# Patient Record
Sex: Male | Born: 1990 | Race: Black or African American | Hispanic: No | Marital: Single | Smoking: Never smoker
Health system: Southern US, Community
[De-identification: ages and names within clinical notes are randomized; demographics above are authoritative.]

---
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
## 2013-05-06 ENCOUNTER — Ambulatory Visit: Payer: Self-pay | Admitting: Family Medicine

## 2013-05-06 VITALS — BP 110/70 | HR 67 | Temp 97.8°F | Resp 16 | Ht 70.0 in | Wt 137.0 lb

## 2013-05-06 DIAGNOSIS — Z130 Encounter for screening for diseases of the blood and blood-forming organs and certain disorders involving the immune mechanism: Secondary | ICD-10-CM

## 2013-05-06 DIAGNOSIS — Z0000 Encounter for general adult medical examination without abnormal findings: Secondary | ICD-10-CM

## 2013-05-06 NOTE — Progress Notes (Signed)
[facility] [address] [place] [address] [phone]  Date:  05/06/2013   Name:  Willie Trujillo   DOB:  [birth year]   MRN:  [id number]  PCP:  No primary provider on file.    Chief Complaint: Annual Exam   History of Present Illness:  Willie Trujillo is a 22 y.o. very pleasant male patient who presents with the following:  He is planning to try out for the [employer]- try outs are next Thursday.  He is generally healthy, exercises a lot and has never had any syncope or CP while exercising.  He also needs a sickle cell screen.    Went over extensive history form for [employer] sports physical he is pan- negative  He has never been a smoker.  No chronic medications, NKDA  There are no active problems to display for this patient.   History reviewed. No pertinent past medical history.  History reviewed. No pertinent past surgical history.  History  Substance Use Topics  . Smoking status: Never Smoker   . Smokeless tobacco: Not on file  . Alcohol Use: No    History reviewed. No pertinent family history.  No Known Allergies  Medication list has been reviewed and updated.  No current outpatient prescriptions on file prior to visit.   No current facility-administered medications on file prior to visit.    Review of Systems:  As per HPI- otherwise negative.   Physical Examination: Filed Vitals:   05/06/13 1331  BP: 110/70  Pulse: 67  Temp: 97.8 F (36.6 C)  Resp: 16   Filed Vitals:   05/06/13 1331  Height: 5\' 10"  (1.778 m)  Weight: 137 lb (62.143 kg)   Body mass index is 19.66 kg/(m^2). Ideal Body Weight: Weight in (lb) to have BMI = 25: 173.9  GEN: WDWN, NAD, Non-toxic, A & O x 3 HEENT: Atraumatic, Normocephalic. Neck supple. No masses, No LAD. Ears and Nose: No external deformity. CV: RRR, No M/G/R. No JVD. No thrill. No extra heart sounds. PULM: CTA B, no wheezes, crackles, rhonchi. No retractions. No resp.  distress. No accessory muscle use. ABD: S, NT, ND, +BS. No rebound. No HSM. EXTR: No c/c/e.  Normal strength, ROM of joints, normal DTR all extremities  NEURO Normal gait.  PSYCH: Normally interactive. Conversant. Not depressed or anxious appearing.  Calm demeanor.  GU: no inguinal hernia  Assessment and Plan: Screening for sickle-cell disease or trait - Plan: Sickle cell screen  Physical exam  PE for sports.  Cleared for participation, await SS trait   Signed Abbe Amsterdam, MD

## 2015-03-14 DIAGNOSIS — S63619A Unspecified sprain of unspecified finger, initial encounter: Secondary | ICD-10-CM

## 2015-03-14 DIAGNOSIS — S63609A Unspecified sprain of unspecified thumb, initial encounter: Secondary | ICD-10-CM | POA: Insufficient documentation

## 2015-05-19 ENCOUNTER — Ambulatory Visit (INDEPENDENT_AMBULATORY_CARE_PROVIDER_SITE_OTHER): Payer: Managed Care, Other (non HMO) | Admitting: Family Medicine

## 2015-05-19 ENCOUNTER — Ambulatory Visit (INDEPENDENT_AMBULATORY_CARE_PROVIDER_SITE_OTHER): Payer: Managed Care, Other (non HMO)

## 2015-05-19 VITALS — BP 100/60 | HR 81 | Temp 98.7°F | Resp 16 | Ht 72.0 in | Wt 139.0 lb

## 2015-05-19 DIAGNOSIS — M79661 Pain in right lower leg: Secondary | ICD-10-CM

## 2015-05-19 DIAGNOSIS — M25571 Pain in right ankle and joints of right foot: Secondary | ICD-10-CM

## 2015-05-19 DIAGNOSIS — M25471 Effusion, right ankle: Secondary | ICD-10-CM | POA: Diagnosis not present

## 2015-05-19 NOTE — Progress Notes (Signed)
Subjective:    Patient ID: Willie Trujillo, male    DOB: September 23, 1990, 24 y.o.   MRN: [id number] This chart was scribed for Willie Staggers, MD by Littie Deeds, Medical Scribe. This patient was seen in Room 4 and the patient's care was started at 4:31 PM.   HPI HPI Comments: Willie Trujillo is a 24 y.o. male who presents to the [facility] complaining of sudden onset right ankle pain with swelling that started yesterday after twisting his ankle while playing flag football. The pain does extend to part of his right lower leg. Patient is unsure of the mechanism of injury. The pain is worse when bearing weight, but he was able to limp off the field after the injury. He has applied ice to his ankle, but he has not tried any medications. Patient denies previous ankle fractures. He is in school at [employer], studying sports science. He wants to be a Mudlogger or an Runner, broadcasting/film/video.   There are no active problems to display for this patient.  No past medical history on file. No past surgical history on file. No Known Allergies Prior to Admission medications   Not on File   Social History   Social History  . Marital Status: Single    Spouse Name: N/A  . Number of Children: N/A  . Years of Education: N/A   Occupational History  . Not on file.   Social History Main Topics  . Smoking status: Never Smoker   . Smokeless tobacco: Not on file  . Alcohol Use: No  . Drug Use: No  . Sexual Activity: Not on file   Other Topics Concern  . Not on file   Social History Narrative     Review of Systems  Musculoskeletal: Positive for joint swelling and arthralgias.       Objective:   Physical Exam  Constitutional: He is oriented to person, place, and time. He appears well-developed and well-nourished. No distress.  HENT:  Head: Normocephalic and atraumatic.  Mouth/Throat: Oropharynx is clear and moist. No oropharyngeal exudate.  Eyes: Pupils are equal, round, and  reactive to light.  Neck: Neck supple.  Cardiovascular: Normal rate.   Pulmonary/Chest: Effort normal.  Musculoskeletal: He exhibits tenderness.  Right knee non-tender. Mid-tibia tender, no apparent soft tissue swelling or ecchymosis. 5th metatarsal and navicula are non-tender. Medial malleous tender, mid and anterior aspect. Achilles non-tender. Calcaneous non-tender. Diffusely tender with soft tissue swelling over lateral malleolus. Guarded ROM slightly decreased. Positve Kleiger. Negative squeeze. Pain with drawer and talar tilt.  Neurological: He is alert and oriented to person, place, and time. No cranial nerve deficit.  NVI distally.  Skin: Skin is warm and dry. No rash noted.  Psychiatric: He has a normal mood and affect. His behavior is normal.  Nursing note and vitals reviewed.  [facility] (PRIMARY) x-ray report read by Dr. Neva Seat:  Right Tib/fib - No apparent fracture. Right ankle - No apparent fracture.   Filed Vitals:   05/19/15 1613  BP: 100/60  Pulse: 81  Temp: 98.7 F (37.1 C)  Resp: 16  Height: 6' (1.829 m)  Weight: 139 lb (63.05 kg)  SpO2: 98%  PF: 5 L/min      Assessment & Plan:  Willie Trujillo is a 23 y.o. male Pain in joint, ankle and foot, right - Plan: DG Ankle Complete Right  Pain of right lower leg - Plan: DG Tibia/Fibula Right  Swelling of ankle, right - Plan:  DG Ankle Complete Right  Suspected lower tibial contusion, no significant overlying ecchymosis or soft tissue swelling. Symptomatic care discussed.  Right lateral ankle sprain, plus/minus mild high sprain. No apparent fracture seen on x-ray.   -He had a lace up ankle brace already, so was able to use this brace, however he was not appropriately using this, so was fitted and trained on use by my assistant.   - ice, anti-inflammatory, and symptomatic care discussed. Handout given on home exercise program and return to clinic precautions as well as return to sport instructions discussed.  No orders of  the defined types were placed in this encounter.   Patient Instructions  I do not see any broken bones in your x-rays tonight. You may have had a contusion to the front of your lower leg, but if that pain continues this week, can always return for repeat evaluation.  I suspect you have an ankle sprain as we discussed, so can wear the ankle brace as discussed for the next week or two.  See information on ankle sprain below, okay to apply ice on and off for 10 minutes at a time, Tylenol or ibuprofen over-the-counter as needed. If not improving in the next 2 weeks, or worsening sooner, return for recheck.   Acute Ankle Sprain with Phase I Rehab An acute ankle sprain is a partial or complete tear in one or more of the ligaments of the ankle due to traumatic injury. The severity of the injury depends on both the number of ligaments sprained and the grade of sprain. There are 3 grades of sprains.   A grade 1 sprain is a mild sprain. There is a slight pull without obvious tearing. There is no loss of strength, and the muscle and ligament are the correct length.  A grade 2 sprain is a moderate sprain. There is tearing of fibers within the substance of the ligament where it connects two bones or two cartilages. The length of the ligament is increased, and there is usually decreased strength.  A grade 3 sprain is a complete rupture of the ligament and is uncommon. In addition to the grade of sprain, there are three types of ankle sprains.  Lateral ankle sprains: This is a sprain of one or more of the three ligaments on the outer side (lateral) of the ankle. These are the most common sprains. Medial ankle sprains: There is one large triangular ligament of the inner side (medial) of the ankle that is susceptible to injury. Medial ankle sprains are less common. Syndesmosis, "high ankle," sprains: The syndesmosis is the ligament that connects the two bones of the lower leg. Syndesmosis sprains usually only  occur with very severe ankle sprains. SYMPTOMS  Pain, tenderness, and swelling in the ankle, starting at the side of injury that may progress to the whole ankle and foot with time.  "Pop" or tearing sensation at the time of injury.  Bruising that may spread to the heel.  Impaired ability to walk soon after injury. CAUSES   Acute ankle sprains are caused by trauma placed on the ankle that temporarily forces or pries the anklebone (talus) out of its normal socket.  Stretching or tearing of the ligaments that normally hold the joint in place (usually due to a twisting injury). RISK INCREASES WITH:  Previous ankle sprain.  Sports in which the foot may land awkwardly (i.e., basketball, volleyball, or soccer) or walking or running on uneven or rough surfaces.  Shoes with inadequate support to prevent  sideways motion when stress occurs.  Poor strength and flexibility.  Poor balance skills.  Contact sports. PREVENTION   Warm up and stretch properly before activity.  Maintain physical fitness:  Ankle and leg flexibility, muscle strength, and endurance.  Cardiovascular fitness.  Balance training activities.  Use proper technique and have a coach correct improper technique.  Taping, protective strapping, bracing, or high-top tennis shoes may help prevent injury. Initially, tape is best; however, it loses most of its support function within 10 to 15 minutes.  Wear proper-fitted protective shoes (High-top shoes with taping or bracing is more effective than either alone).  Provide the ankle with support during sports and practice activities for 12 months following injury. PROGNOSIS   If treated properly, ankle sprains can be expected to recover completely; however, the length of recovery depends on the degree of injury.  A grade 1 sprain usually heals enough in 5 to 7 days to allow modified activity and requires an average of 6 weeks to heal completely.  A grade 2 sprain  requires 6 to 10 weeks to heal completely.  A grade 3 sprain requires 12 to 16 weeks to heal.  A syndesmosis sprain often takes more than 3 months to heal. RELATED COMPLICATIONS   Frequent recurrence of symptoms may result in a chronic problem. Appropriately addressing the problem the first time decreases the frequency of recurrence and optimizes healing time. Severity of the initial sprain does not predict the likelihood of later instability.  Injury to other structures (bone, cartilage, or tendon).  A chronically unstable or arthritic ankle joint is a possibility with repeated sprains. TREATMENT Treatment initially involves the use of ice, medication, and compression bandages to help reduce pain and inflammation. Ankle sprains are usually immobilized in a walking cast or boot to allow for healing. Crutches may be recommended to reduce pressure on the injury. After immobilization, strengthening and stretching exercises may be necessary to regain strength and a full range of motion. Surgery is rarely needed to treat ankle sprains. MEDICATION   Nonsteroidal anti-inflammatory medications, such as aspirin and ibuprofen (do not take for the first 3 days after injury or within 7 days before surgery), or other minor pain relievers, such as acetaminophen, are often recommended. Take these as directed by your caregiver. Contact your caregiver immediately if any bleeding, stomach upset, or signs of an allergic reaction occur from these medications.  Ointments applied to the skin may be helpful.  Pain relievers may be prescribed as necessary by your caregiver. Do not take prescription pain medication for longer than 4 to 7 days. Use only as directed and only as much as you need. HEAT AND COLD  Cold treatment (icing) is used to relieve pain and reduce inflammation for acute and chronic cases. Cold should be applied for 10 to 15 minutes every 2 to 3 hours for inflammation and pain and immediately after  any activity that aggravates your symptoms. Use ice packs or an ice massage.  Heat treatment may be used before performing stretching and strengthening activities prescribed by your caregiver. Use a heat pack or a warm soak. SEEK IMMEDIATE MEDICAL CARE IF:   Pain, swelling, or bruising worsens despite treatment.  You experience pain, numbness, discoloration, or coldness in the foot or toes.  New, unexplained symptoms develop (drugs used in treatment may produce side effects.) EXERCISES  PHASE I EXERCISES RANGE OF MOTION (ROM) AND STRETCHING EXERCISES - Ankle Sprain, Acute Phase I, Weeks 1 to 2 These exercises may help you  when beginning to restore flexibility in your ankle. You will likely work on these exercises for the 1 to 2 weeks after your injury. Once your physician, physical therapist, or athletic trainer sees adequate progress, he or she will advance your exercises. While completing these exercises, remember:   Restoring tissue flexibility helps normal motion to return to the joints. This allows healthier, less painful movement and activity.  An effective stretch should be held for at least 30 seconds.  A stretch should never be painful. You should only feel a gentle lengthening or release in the stretched tissue. RANGE OF MOTION - Dorsi/Plantar Flexion  While sitting with your right / left knee straight, draw the top of your foot upwards by flexing your ankle. Then reverse the motion, pointing your toes downward.  Hold each position for __________ seconds.  After completing your first set of exercises, repeat this exercise with your knee bent. Repeat __________ times. Complete this exercise __________ times per day.  RANGE OF MOTION - Ankle Alphabet  Imagine your right / left big toe is a pen.  Keeping your hip and knee still, write out the entire alphabet with your "pen." Make the letters as large as you can without increasing any discomfort. Repeat __________ times.  Complete this exercise __________ times per day.  STRENGTHENING EXERCISES - Ankle Sprain, Acute -Phase I, Weeks 1 to 2 These exercises may help you when beginning to restore strength in your ankle. You will likely work on these exercises for 1 to 2 weeks after your injury. Once your physician, physical therapist, or athletic trainer sees adequate progress, he or she will advance your exercises. While completing these exercises, remember:   Muscles can gain both the endurance and the strength needed for everyday activities through controlled exercises.  Complete these exercises as instructed by your physician, physical therapist, or athletic trainer. Progress the resistance and repetitions only as guided.  You may experience muscle soreness or fatigue, but the pain or discomfort you are trying to eliminate should never worsen during these exercises. If this pain does worsen, stop and make certain you are following the directions exactly. If the pain is still present after adjustments, discontinue the exercise until you can discuss the trouble with your clinician. STRENGTH - Dorsiflexors  Secure a rubber exercise band/tubing to a fixed object (i.e., table, pole) and loop the other end around your right / left foot.  Sit on the floor facing the fixed object. The band/tubing should be slightly tense when your foot is relaxed.  Slowly draw your foot back toward you using your ankle and toes.  Hold this position for __________ seconds. Slowly release the tension in the band and return your foot to the starting position. Repeat __________ times. Complete this exercise __________ times per day.  STRENGTH - Plantar-flexors   Sit with your right / left leg extended. Holding onto both ends of a rubber exercise band/tubing, loop it around the ball of your foot. Keep a slight tension in the band.  Slowly push your toes away from you, pointing them downward.  Hold this position for __________ seconds.  Return slowly, controlling the tension in the band/tubing. Repeat __________ times. Complete this exercise __________ times per day.  STRENGTH - Ankle Eversion  Secure one end of a rubber exercise band/tubing to a fixed object (table, pole). Loop the other end around your foot just before your toes.  Place your fists between your knees. This will focus your strengthening at your ankle.  Drawing the band/tubing across your opposite foot, slowly, pull your little toe out and up. Make sure the band/tubing is positioned to resist the entire motion.  Hold this position for __________ seconds. Have your muscles resist the band/tubing as it slowly pulls your foot back to the starting position.  Repeat __________ times. Complete this exercise __________ times per day.  STRENGTH - Ankle Inversion  Secure one end of a rubber exercise band/tubing to a fixed object (table, pole). Loop the other end around your foot just before your toes.  Place your fists between your knees. This will focus your strengthening at your ankle.  Slowly, pull your big toe up and in, making sure the band/tubing is positioned to resist the entire motion.  Hold this position for __________ seconds.  Have your muscles resist the band/tubing as it slowly pulls your foot back to the starting position. Repeat __________ times. Complete this exercises __________ times per day.  STRENGTH - Towel Curls  Sit in a chair positioned on a non-carpeted surface.  Place your right / left foot on a towel, keeping your heel on the floor.  Pull the towel toward your heel by only curling your toes. Keep your heel on the floor.  If instructed by your physician, physical therapist, or athletic trainer, add weight to the end of the towel. Repeat __________ times. Complete this exercise __________ times per day. Document Released: 03/05/2005 Document Revised: 12/19/2013 Document Reviewed: 11/16/2008 [facility] Patient Information 2015  [place], [place]. This information is not intended to replace advice given to you by your health care provider. Make sure you discuss any questions you have with your health care provider.   Contusion A contusion is the result of an injury to the skin and underlying tissues and is usually caused by direct trauma. The injury results in the appearance of a bruise on the skin overlying the injured tissues. Contusions cause rupture and bleeding of the small capillaries and blood vessels and affect function, because the bleeding infiltrates muscles, tendons, nerves, or other soft tissues.  SYMPTOMS   Swelling and often a hard lump in the injured area, either superficial or deep.  Pain and tenderness over the area of the contusion.  Feeling of firmness when pressure is exerted over the contusion.  Discoloration under the skin, beginning with redness and progressing to the characteristic "black and blue" bruise. CAUSES  A contusion is typically the result of direct trauma. This is often by a blunt object.  RISK INCREASES WITH:  Sports that have a high likelihood of trauma (football, boxing, ice hockey, soccer, field hockey, martial arts, basketball, and baseball).  Sports that make falling from a height likely (high-jumping, pole-vaulting, skating, or gymnastics).  Any bleeding disorder (hemophilia) or taking medications that affect clotting (aspirin, nonsteroidal anti-inflammatory medications, or warfarin [Coumadin]).  Inadequate protection of exposed areas during contact sports. PREVENTION  Maintain physical fitness:  Joint and muscle flexibility.  Strength and endurance.  Coordination.  Wear proper protective equipment. Make sure it fits correctly. PROGNOSIS  Contusions typically heal without any complications. Healing time varies with the severity of injury and intake of medications that affect clotting. Contusions usually heal in 1 to 4 weeks. RELATED COMPLICATIONS   Damage to  nearby nerves or blood vessels, causing numbness, coldness, or paleness.  Compartment syndrome.  Bleeding into the soft tissues that leads to disability.  Infiltrative-type bleeding, leading to the calcification and impaired function of the injured muscle (rare).  Prolonged healing time if usual activities  are resumed too soon.  Infection if the skin over the injury site is broken.  Fracture of the bone underlying the contusion.  Stiffness in the joint where the injured muscle crosses. TREATMENT  Treatment initially consists of resting the injured area as well as medication and ice to reduce inflammation. The use of a compression bandage may also be helpful in minimizing inflammation. As pain diminishes and movement is tolerated, the joint where the affected muscle crosses should be moved to prevent stiffness and the shortening (contracture) of the joint. Movement of the joint should begin as soon as possible. It is also important to work on maintaining strength within the affected muscles. Occasionally, extra padding over the area of contusion may be recommended before returning to sports, particularly if re-injury is likely.  MEDICATION   If pain relief is necessary these medications are often recommended:  Nonsteroidal anti-inflammatory medications, such as aspirin and ibuprofen.  Other minor pain relievers, such as acetaminophen, are often recommended.  Prescription pain relievers may be given by your caregiver. Use only as directed and only as much as you need. HEAT AND COLD  Cold treatment (icing) relieves pain and reduces inflammation. Cold treatment should be applied for 10 to 15 minutes every 2 to 3 hours for inflammation and pain and immediately after any activity that aggravates your symptoms. Use ice packs or an ice massage. (To do an ice massage fill a large styrofoam cup with water and freeze. Tear a small amount of foam from the top so ice protrudes. Massage ice firmly  over the injured area in a circle about the size of a softball.)  Heat treatment may be used prior to performing the stretching and strengthening activities prescribed by your caregiver, physical therapist, or athletic trainer. Use a heat pack or a warm soak. SEEK MEDICAL CARE IF:   Symptoms get worse or do not improve despite treatment in a few days.  You have difficulty moving a joint.  Any extremity becomes extremely painful, numb, pale, or cool (This is an emergency!).  Medication produces any side effects (bleeding, upset stomach, or allergic reaction).  Signs of infection (drainage from skin, headache, muscle aches, dizziness, fever, or general ill feeling) occur if skin was broken. Document Released: 08/04/2005 Document Revised: 10/27/2011 Document Reviewed: 11/16/2008 [facility] Patient Information 2015 [place], [place]. This information is not intended to replace advice given to you by your health care provider. Make sure you discuss any questions you have with your health care provider.     I personally performed the services described in this documentation, which was scribed in my presence. The recorded information has been reviewed and considered, and addended by me as needed.    By signing my name below, I, Littie Deeds, attest that this documentation has been prepared under the direction and in the presence of Willie Staggers, MD.  Electronically Signed: Littie Deeds, Medical Scribe. 05/19/2015. 4:31 PM.

## 2015-05-19 NOTE — Patient Instructions (Addendum)
I do not see any broken bones in your x-rays tonight. You may have had a contusion to the front of your lower leg, but if that pain continues this week, can always return for repeat evaluation.  I suspect you have an ankle sprain as we discussed, so can wear the ankle brace as discussed for the next week or two.  See information on ankle sprain below, okay to apply ice on and off for 10 minutes at a time, Tylenol or ibuprofen over-the-counter as needed. If not improving in the next 2 weeks, or worsening sooner, return for recheck.   Acute Ankle Sprain with Phase I Rehab An acute ankle sprain is a partial or complete tear in one or more of the ligaments of the ankle due to traumatic injury. The severity of the injury depends on both the number of ligaments sprained and the grade of sprain. There are 3 grades of sprains.   A grade 1 sprain is a mild sprain. There is a slight pull without obvious tearing. There is no loss of strength, and the muscle and ligament are the correct length.  A grade 2 sprain is a moderate sprain. There is tearing of fibers within the substance of the ligament where it connects two bones or two cartilages. The length of the ligament is increased, and there is usually decreased strength.  A grade 3 sprain is a complete rupture of the ligament and is uncommon. In addition to the grade of sprain, there are three types of ankle sprains.  Lateral ankle sprains: This is a sprain of one or more of the three ligaments on the outer side (lateral) of the ankle. These are the most common sprains. Medial ankle sprains: There is one large triangular ligament of the inner side (medial) of the ankle that is susceptible to injury. Medial ankle sprains are less common. Syndesmosis, "high ankle," sprains: The syndesmosis is the ligament that connects the two bones of the lower leg. Syndesmosis sprains usually only occur with very severe ankle sprains. SYMPTOMS  Pain, tenderness, and  swelling in the ankle, starting at the side of injury that may progress to the whole ankle and foot with time.  "Pop" or tearing sensation at the time of injury.  Bruising that may spread to the heel.  Impaired ability to walk soon after injury. CAUSES   Acute ankle sprains are caused by trauma placed on the ankle that temporarily forces or pries the anklebone (talus) out of its normal socket.  Stretching or tearing of the ligaments that normally hold the joint in place (usually due to a twisting injury). RISK INCREASES WITH:  Previous ankle sprain.  Sports in which the foot may land awkwardly (i.e., basketball, volleyball, or soccer) or walking or running on uneven or rough surfaces.  Shoes with inadequate support to prevent sideways motion when stress occurs.  Poor strength and flexibility.  Poor balance skills.  Contact sports. PREVENTION   Warm up and stretch properly before activity.  Maintain physical fitness:  Ankle and leg flexibility, muscle strength, and endurance.  Cardiovascular fitness.  Balance training activities.  Use proper technique and have a coach correct improper technique.  Taping, protective strapping, bracing, or high-top tennis shoes may help prevent injury. Initially, tape is best; however, it loses most of its support function within 10 to 15 minutes.  Wear proper-fitted protective shoes (High-top shoes with taping or bracing is more effective than either alone).  Provide the ankle with support during sports and practice  activities for 12 months following injury. PROGNOSIS   If treated properly, ankle sprains can be expected to recover completely; however, the length of recovery depends on the degree of injury.  A grade 1 sprain usually heals enough in 5 to 7 days to allow modified activity and requires an average of 6 weeks to heal completely.  A grade 2 sprain requires 6 to 10 weeks to heal completely.  A grade 3 sprain requires 12 to  16 weeks to heal.  A syndesmosis sprain often takes more than 3 months to heal. RELATED COMPLICATIONS   Frequent recurrence of symptoms may result in a chronic problem. Appropriately addressing the problem the first time decreases the frequency of recurrence and optimizes healing time. Severity of the initial sprain does not predict the likelihood of later instability.  Injury to other structures (bone, cartilage, or tendon).  A chronically unstable or arthritic ankle joint is a possibility with repeated sprains. TREATMENT Treatment initially involves the use of ice, medication, and compression bandages to help reduce pain and inflammation. Ankle sprains are usually immobilized in a walking cast or boot to allow for healing. Crutches may be recommended to reduce pressure on the injury. After immobilization, strengthening and stretching exercises may be necessary to regain strength and a full range of motion. Surgery is rarely needed to treat ankle sprains. MEDICATION   Nonsteroidal anti-inflammatory medications, such as aspirin and ibuprofen (do not take for the first 3 days after injury or within 7 days before surgery), or other minor pain relievers, such as acetaminophen, are often recommended. Take these as directed by your caregiver. Contact your caregiver immediately if any bleeding, stomach upset, or signs of an allergic reaction occur from these medications.  Ointments applied to the skin may be helpful.  Pain relievers may be prescribed as necessary by your caregiver. Do not take prescription pain medication for longer than 4 to 7 days. Use only as directed and only as much as you need. HEAT AND COLD  Cold treatment (icing) is used to relieve pain and reduce inflammation for acute and chronic cases. Cold should be applied for 10 to 15 minutes every 2 to 3 hours for inflammation and pain and immediately after any activity that aggravates your symptoms. Use ice packs or an ice  massage.  Heat treatment may be used before performing stretching and strengthening activities prescribed by your caregiver. Use a heat pack or a warm soak. SEEK IMMEDIATE MEDICAL CARE IF:   Pain, swelling, or bruising worsens despite treatment.  You experience pain, numbness, discoloration, or coldness in the foot or toes.  New, unexplained symptoms develop (drugs used in treatment may produce side effects.) EXERCISES  PHASE I EXERCISES RANGE OF MOTION (ROM) AND STRETCHING EXERCISES - Ankle Sprain, Acute Phase I, Weeks 1 to 2 These exercises may help you when beginning to restore flexibility in your ankle. You will likely work on these exercises for the 1 to 2 weeks after your injury. Once your physician, physical therapist, or athletic trainer sees adequate progress, he or she will advance your exercises. While completing these exercises, remember:   Restoring tissue flexibility helps normal motion to return to the joints. This allows healthier, less painful movement and activity.  An effective stretch should be held for at least 30 seconds.  A stretch should never be painful. You should only feel a gentle lengthening or release in the stretched tissue. RANGE OF MOTION - Dorsi/Plantar Flexion  While sitting with your right / left knee  straight, draw the top of your foot upwards by flexing your ankle. Then reverse the motion, pointing your toes downward.  Hold each position for __________ seconds.  After completing your first set of exercises, repeat this exercise with your knee bent. Repeat __________ times. Complete this exercise __________ times per day.  RANGE OF MOTION - Ankle Alphabet  Imagine your right / left big toe is a pen.  Keeping your hip and knee still, write out the entire alphabet with your "pen." Make the letters as large as you can without increasing any discomfort. Repeat __________ times. Complete this exercise __________ times per day.  STRENGTHENING EXERCISES  - Ankle Sprain, Acute -Phase I, Weeks 1 to 2 These exercises may help you when beginning to restore strength in your ankle. You will likely work on these exercises for 1 to 2 weeks after your injury. Once your physician, physical therapist, or athletic trainer sees adequate progress, he or she will advance your exercises. While completing these exercises, remember:   Muscles can gain both the endurance and the strength needed for everyday activities through controlled exercises.  Complete these exercises as instructed by your physician, physical therapist, or athletic trainer. Progress the resistance and repetitions only as guided.  You may experience muscle soreness or fatigue, but the pain or discomfort you are trying to eliminate should never worsen during these exercises. If this pain does worsen, stop and make certain you are following the directions exactly. If the pain is still present after adjustments, discontinue the exercise until you can discuss the trouble with your clinician. STRENGTH - Dorsiflexors  Secure a rubber exercise band/tubing to a fixed object (i.e., table, pole) and loop the other end around your right / left foot.  Sit on the floor facing the fixed object. The band/tubing should be slightly tense when your foot is relaxed.  Slowly draw your foot back toward you using your ankle and toes.  Hold this position for __________ seconds. Slowly release the tension in the band and return your foot to the starting position. Repeat __________ times. Complete this exercise __________ times per day.  STRENGTH - Plantar-flexors   Sit with your right / left leg extended. Holding onto both ends of a rubber exercise band/tubing, loop it around the ball of your foot. Keep a slight tension in the band.  Slowly push your toes away from you, pointing them downward.  Hold this position for __________ seconds. Return slowly, controlling the tension in the band/tubing. Repeat __________  times. Complete this exercise __________ times per day.  STRENGTH - Ankle Eversion  Secure one end of a rubber exercise band/tubing to a fixed object (table, pole). Loop the other end around your foot just before your toes.  Place your fists between your knees. This will focus your strengthening at your ankle.  Drawing the band/tubing across your opposite foot, slowly, pull your little toe out and up. Make sure the band/tubing is positioned to resist the entire motion.  Hold this position for __________ seconds. Have your muscles resist the band/tubing as it slowly pulls your foot back to the starting position.  Repeat __________ times. Complete this exercise __________ times per day.  STRENGTH - Ankle Inversion  Secure one end of a rubber exercise band/tubing to a fixed object (table, pole). Loop the other end around your foot just before your toes.  Place your fists between your knees. This will focus your strengthening at your ankle.  Slowly, pull your big toe up and  in, making sure the band/tubing is positioned to resist the entire motion.  Hold this position for __________ seconds.  Have your muscles resist the band/tubing as it slowly pulls your foot back to the starting position. Repeat __________ times. Complete this exercises __________ times per day.  STRENGTH - Towel Curls  Sit in a chair positioned on a non-carpeted surface.  Place your right / left foot on a towel, keeping your heel on the floor.  Pull the towel toward your heel by only curling your toes. Keep your heel on the floor.  If instructed by your physician, physical therapist, or athletic trainer, add weight to the end of the towel. Repeat __________ times. Complete this exercise __________ times per day. Document Released: 03/05/2005 Document Revised: 12/19/2013 Document Reviewed: 11/16/2008 [facility] Patient Information 2015 [place], [place]. This information is not intended to replace advice given to you by  your health care provider. Make sure you discuss any questions you have with your health care provider.   Contusion A contusion is the result of an injury to the skin and underlying tissues and is usually caused by direct trauma. The injury results in the appearance of a bruise on the skin overlying the injured tissues. Contusions cause rupture and bleeding of the small capillaries and blood vessels and affect function, because the bleeding infiltrates muscles, tendons, nerves, or other soft tissues.  SYMPTOMS   Swelling and often a hard lump in the injured area, either superficial or deep.  Pain and tenderness over the area of the contusion.  Feeling of firmness when pressure is exerted over the contusion.  Discoloration under the skin, beginning with redness and progressing to the characteristic "black and blue" bruise. CAUSES  A contusion is typically the result of direct trauma. This is often by a blunt object.  RISK INCREASES WITH:  Sports that have a high likelihood of trauma (football, boxing, ice hockey, soccer, field hockey, martial arts, basketball, and baseball).  Sports that make falling from a height likely (high-jumping, pole-vaulting, skating, or gymnastics).  Any bleeding disorder (hemophilia) or taking medications that affect clotting (aspirin, nonsteroidal anti-inflammatory medications, or warfarin [Coumadin]).  Inadequate protection of exposed areas during contact sports. PREVENTION  Maintain physical fitness:  Joint and muscle flexibility.  Strength and endurance.  Coordination.  Wear proper protective equipment. Make sure it fits correctly. PROGNOSIS  Contusions typically heal without any complications. Healing time varies with the severity of injury and intake of medications that affect clotting. Contusions usually heal in 1 to 4 weeks. RELATED COMPLICATIONS   Damage to nearby nerves or blood vessels, causing numbness, coldness, or  paleness.  Compartment syndrome.  Bleeding into the soft tissues that leads to disability.  Infiltrative-type bleeding, leading to the calcification and impaired function of the injured muscle (rare).  Prolonged healing time if usual activities are resumed too soon.  Infection if the skin over the injury site is broken.  Fracture of the bone underlying the contusion.  Stiffness in the joint where the injured muscle crosses. TREATMENT  Treatment initially consists of resting the injured area as well as medication and ice to reduce inflammation. The use of a compression bandage may also be helpful in minimizing inflammation. As pain diminishes and movement is tolerated, the joint where the affected muscle crosses should be moved to prevent stiffness and the shortening (contracture) of the joint. Movement of the joint should begin as soon as possible. It is also important to work on maintaining strength within the affected muscles.  Occasionally, extra padding over the area of contusion may be recommended before returning to sports, particularly if re-injury is likely.  MEDICATION   If pain relief is necessary these medications are often recommended:  Nonsteroidal anti-inflammatory medications, such as aspirin and ibuprofen.  Other minor pain relievers, such as acetaminophen, are often recommended.  Prescription pain relievers may be given by your caregiver. Use only as directed and only as much as you need. HEAT AND COLD  Cold treatment (icing) relieves pain and reduces inflammation. Cold treatment should be applied for 10 to 15 minutes every 2 to 3 hours for inflammation and pain and immediately after any activity that aggravates your symptoms. Use ice packs or an ice massage. (To do an ice massage fill a large styrofoam cup with water and freeze. Tear a small amount of foam from the top so ice protrudes. Massage ice firmly over the injured area in a circle about the size of a  softball.)  Heat treatment may be used prior to performing the stretching and strengthening activities prescribed by your caregiver, physical therapist, or athletic trainer. Use a heat pack or a warm soak. SEEK MEDICAL CARE IF:   Symptoms get worse or do not improve despite treatment in a few days.  You have difficulty moving a joint.  Any extremity becomes extremely painful, numb, pale, or cool (This is an emergency!).  Medication produces any side effects (bleeding, upset stomach, or allergic reaction).  Signs of infection (drainage from skin, headache, muscle aches, dizziness, fever, or general ill feeling) occur if skin was broken. Document Released: 08/04/2005 Document Revised: 10/27/2011 Document Reviewed: 11/16/2008 [facility] Patient Information 2015 [place], [place]. This information is not intended to replace advice given to you by your health care provider. Make sure you discuss any questions you have with your health care provider.

## 2015-12-26 ENCOUNTER — Encounter (HOSPITAL_COMMUNITY): Payer: Self-pay | Admitting: Emergency Medicine

## 2015-12-26 ENCOUNTER — Emergency Department (HOSPITAL_COMMUNITY)
Admission: EM | Admit: 2015-12-26 | Discharge: 2015-12-26 | Disposition: A | Discharge status: Home / Self Care | Payer: Self-pay | Attending: Emergency Medicine | Admitting: Emergency Medicine

## 2015-12-26 DIAGNOSIS — B356 Tinea cruris: Secondary | ICD-10-CM | POA: Insufficient documentation

## 2015-12-26 MED ORDER — TOLNAFTATE 1 % EX AERP: INHALATION_SPRAY | Freq: Two times a day (BID) | CUTANEOUS | Status: DC

## 2015-12-26 NOTE — Discharge Instructions (Signed)
You were not tested for all STDs today. Your gonorrhea and chlamydia tests are pending- if they are positive, you will receive a phone call. Refrain from sex until you have the results from a full STD screen. Please go to [facility] (Address: [address], [place] [address] Phone: [phone]) or see the [facility] (Address: [address] Phone: [phone]) for full STD screening. Return to the emergency room for worsening of symptoms, fever, and vomiting.  Jock Itch Jock itch (tinea cruris) is a fungal infection of the skin in the groin area. It is sometimes called ringworm, even though it is not caused by worms. It is caused by a fungus, which is a type of germ that thrives in dark, damp places. Jock itch causes a rash and itching in the groin and upper thigh area. It usually goes away in 2-3 weeks with treatment. CAUSES The fungus that causes jock itch may be spread by:  Touching a fungus infection elsewhere on your body--such as athlete's foot--and then touching your groin area.  Sharing towels or clothing with an infected person. RISK FACTORS Jock itch is most common in men and adolescent boys. This condition is more likely to develop from:  Being in hot, humid climates.  Wearing tight-fitting clothing or wet bathing suits for long periods of time.  Participating in sports.  Being overweight.  Having diabetes. SYMPTOMS Symptoms of jock itch may include:  A red, pink, or brown rash in the groin area. The rash may spread to the thighs, anus, and buttocks.  Dry and scaly skin on or around the rash.  Itchiness. DIAGNOSIS Most often, a health care provider can make the diagnosis by looking at your rash. Sometimes, a scraping of the infected skin will be taken. This sample may be tested by looking at it under a microscope or by trying to grow the fungus from the sample (culture).  TREATMENT Treatment for this condition may  include:  Antifungal medicine to kill the fungus. This may be in various forms:  Skin cream or ointment.  Medicine taken by mouth.  Skin cream or ointment to reduce the itching.  Compresses or medicated powders to dry the infected skin. HOME CARE INSTRUCTIONS  Take medicines only as directed by your health care provider. Apply skin creams or ointments exactly as directed.  Wear loose-fitting clothing.  Men should wear cotton boxer shorts.  Women should wear cotton underwear.  Change your underwear every day to keep your groin dry.  Avoid hot baths.  Dry your groin area well after bathing.  Use a separate towel to dry your groin area. This will help to prevent a spreading of the infection to other areas of your body.  Do not scratch the affected area.  Do not share towels with other people. SEEK MEDICAL CARE IF:  Your rash does not improve or it gets worse after 2 weeks of treatment.  Your rash is spreading.  Your rash returns after treatment is finished.  You have a fever.  You have redness, swelling, or pain in the area around your rash.  You have fluid, blood, or pus coming from your rash.  Your have your rash for more than 4 weeks.   This information is not intended to replace advice given to you by your health care provider. Make sure you discuss any questions you have with your health care provider.   Document Released: 07/25/2002 Document Revised: 08/25/2014 Document Reviewed: 05/16/2014 Elsevier Interactive Patient Education  2016 [employer] Inc[employer] ° °

## 2015-12-26 NOTE — ED Provider Notes (Signed)
CSN: [id number]     Arrival date & time 12/26/15  2230 History  By signing my name below, I, Freida Busmaniana Omoyeni, attest that this documentation has been prepared under the direction and in the presence of non-physician practitioner, Wynetta EmeryNicole Lajuanda Penick, PA-C. Electronically Signed: Freida Busmaniana Omoyeni, Scribe. 12/26/2015. 10:48 PM.  Chief Complaint  Patient presents with  . Rash   The history is provided by the patient. No language interpreter was used.     HPI Comments:  Willie Trujillo is a 25 y.o. male who presents to the Emergency Department complaining of a patch dry skin to his pubic area x 2-3 weeks. He notes the area is occasionally itchy. Pt states he has no concern for STDs but would like to be tested for them anyway. He admits to having unprotected sex in the last  6 months. He denies dysuria, frequent urination, fever, and  testicular pain. No alleviating factors noted.  History reviewed. No pertinent past medical history. History reviewed. No pertinent past surgical history. No family history on file. Social History  Substance Use Topics  . Smoking status: Never Smoker   . Smokeless tobacco: None  . Alcohol Use: No    Review of Systems  10 systems reviewed and all are negative for acute change except as noted in the HPI.  Allergies  Review of patient's allergies indicates no known allergies.  Home Medications   Prior to Admission medications   Not on File   BP 138/80 mmHg  Pulse 90  Temp(Src) 98.2 F (36.8 C) (Oral)  Resp 18  Ht 6' (1.829 m)  Wt 143 lb 8 oz (65.091 kg)  BMI 19.46 kg/m2  SpO2 100% Physical Exam  Constitutional: He is oriented to person, place, and time. He appears well-developed and well-nourished. No distress.  HENT:  Head: Normocephalic and atraumatic.  Eyes: Conjunctivae are normal.  Cardiovascular: Normal rate.   Pulmonary/Chest: Effort normal.  Abdominal: He exhibits no distension.  Genitourinary:   Chaperone was present for exam which was  performed with no discomfort or complications.   Patient has 1 cm area of slight scale in pubic hair, no warmth, tenderness palpation or discharge.  Neurological: He is alert and oriented to person, place, and time.  Skin: Skin is warm and dry.  Psychiatric: He has a normal mood and affect.  Nursing note and vitals reviewed.   ED Course  Procedures   DIAGNOSTIC STUDIES:  Oxygen Saturation is 100% on RA, normal by my interpretation.    COORDINATION OF CARE:  10:45 PM Discussed treatment plan with pt at bedside and pt agreed to plan.  Labs Review Labs Reviewed  RPR  HIV ANTIBODY (ROUTINE TESTING)  GC/CHLAMYDIA PROBE AMP ([facility]) NOT AT [facility]    Imaging Review No results found. I have personally reviewed and evaluated these images and lab results as part of my medical decision-making.   MDM   Final diagnoses:  Tinea cruris   Filed Vitals:   12/26/15 2238  BP: 138/80  Pulse: 90  Temp: 98.2 F (36.8 C)  TempSrc: Oral  Resp: 18  Height: 6' (1.829 m)  Weight: 65.091 kg  SpO2: 100%    Willie Trujillo is 25 y.o. male presenting with Pruritic lesion to groin consistent with a tinea cruris. Patient's given STD screening at his request, no symptoms consistent with a urethritis. No signs of systemic infection.   Evaluation does not show pathology that would require ongoing emergent intervention or inpatient treatment. Pt is hemodynamically stable  and mentating appropriately. Discussed findings and plan with patient/guardian, who agrees with care plan. All questions answered. Return precautions discussed and outpatient follow up given.   Discharge Medication List as of 12/26/2015 10:48 PM    START taking these medications   Details  tolnaftate (TINACTIN) 1 % spray Apply topically 2 (two) times daily., Starting 12/26/2015, Until Discontinued, Print         I personally performed the services described in this documentation, which was scribed in my presence. The  recorded information has been reviewed and is accurate.   Wynetta Emery, PA-C 12/26/15 2318  Marily Memos, MD 12/26/15 [phone]

## 2015-12-26 NOTE — ED Notes (Signed)
Pt. reports dry skin rash at pubic area onset this week , denies itching or urinary discomfort .

## 2015-12-26 NOTE — ED Notes (Signed)
Blood collected at 2257

## 2015-12-27 LAB — RPR: RPR Ser Ql: NONREACTIVE

## 2015-12-27 LAB — HIV ANTIBODY (ROUTINE TESTING W REFLEX): HIV SCREEN 4TH GENERATION: NONREACTIVE

## 2015-12-27 LAB — GC/CHLAMYDIA PROBE AMP (~~LOC~~) NOT AT ARMC
Chlamydia: NEGATIVE
NEISSERIA GONORRHEA: NEGATIVE

## 2016-01-07 ENCOUNTER — Ambulatory Visit (INDEPENDENT_AMBULATORY_CARE_PROVIDER_SITE_OTHER): Payer: 59 | Admitting: Physician Assistant

## 2016-01-07 VITALS — BP 130/76 | HR 79 | Temp 98.1°F | Resp 16 | Ht 72.0 in | Wt 143.0 lb

## 2016-01-07 DIAGNOSIS — N63 Unspecified lump in unspecified breast: Secondary | ICD-10-CM

## 2016-01-07 NOTE — Progress Notes (Signed)
   01/07/2016 9:08 PM   DOB: 11/03/1990 / MRN: [id number]  SUBJECTIVE:  Willie Trujillo is a 25 y.o. male presenting for a mass deep to the right nipple.  Reports it has been there about 1 year and has not changed.  He noticed that it has been more pronounced because he has been lifting weights.  He denies constitutional symptoms.    He has No Known Allergies.   He  has no past medical history on file.    He  reports that he has never smoked. He has never used smokeless tobacco. He reports that he does not drink alcohol or use illicit drugs. He  has no sexual activity history on file. The patient  has no past surgical history on file.  His family history is not on file.  Review of Systems  Constitutional: Negative for fever and chills.  Eyes: Negative for blurred vision.  Respiratory: Negative for cough and shortness of breath.   Cardiovascular: Negative for chest pain.  Gastrointestinal: Negative for nausea and abdominal pain.  Genitourinary: Negative for dysuria, urgency and frequency.  Musculoskeletal: Negative for myalgias.  Skin: Negative for rash.  Neurological: Negative for dizziness, tingling and headaches.  Psychiatric/Behavioral: Negative for depression. The patient is not nervous/anxious.     Problem list and medications reviewed and updated by myself where necessary, and exist elsewhere in the encounter.   OBJECTIVE:  BP 130/76 mmHg  Pulse 79  Temp(Src) 98.1 F (36.7 C) (Oral)  Resp 16  Ht 6' (1.829 m)  Wt 143 lb (64.864 kg)  BMI 19.39 kg/m2  SpO2 99%  Physical Exam  Constitutional: He is oriented to person, place, and time. He appears well-developed. He does not appear ill.  Eyes: Conjunctivae and EOM are normal. Pupils are equal, round, and reactive to light.  Cardiovascular: Normal rate.   Pulmonary/Chest: Effort normal.    Abdominal: He exhibits no distension.  Musculoskeletal: Normal range of motion.  Neurological: He is alert and oriented to person,  place, and time. No cranial nerve deficit. Coordination normal.  Skin: Skin is warm and dry. He is not diaphoretic.  Psychiatric: He has a normal mood and affect.  Nursing note and vitals reviewed.   No results found for this or any previous visit (from the past 72 hour(s)).  No results found.  ASSESSMENT AND PLAN  Willie Trujillo was seen today for lump on chest.  Diagnoses and all orders for this visit:  Breast mass in male: Will ultrasound.   -     US BREAST COMPLETE UNI RIGHT INC AXILLA; Future    The patient was advised to call or return to clinic if he does not see an improvement in symptoms or to seek the care of the closest emergency department if he worsens with the above plan.   Deliah BostonMichael Clark, MHS, PA-C [facility] 01/07/2016 9:08 PM

## 2016-01-07 NOTE — Patient Instructions (Addendum)
Your appointment is Wednesday 01/09/2016 at 840am. [address], [place], [place] [address].  [phone].     IF you received an x-ray today, you will receive an invoice from [facility]. Please contact [facility] at [phone] with questions or concerns regarding your invoice.   IF you received labwork today, you will receive an invoice from [facility]. Please contact [facility] at [phone] with questions or concerns regarding your invoice.   Our billing staff will not be able to assist you with questions regarding bills from these companies.  You will be contacted with the lab results as soon as they are available. The fastest way to get your results is to activate your My Chart account. Instructions are located on the last page of this paperwork. If you have not heard from us regarding the results in 2 weeks, please contact this office.

## 2016-01-09 ENCOUNTER — Ambulatory Visit
Admission: RE | Admit: 2016-01-09 | Discharge: 2016-01-09 | Disposition: A | Discharge status: Home / Self Care | Payer: Self-pay | Source: Ambulatory Visit | Attending: Physician Assistant | Admitting: Physician Assistant

## 2016-01-09 DIAGNOSIS — N63 Unspecified lump in unspecified breast: Secondary | ICD-10-CM

## 2016-02-13 ENCOUNTER — Ambulatory Visit (INDEPENDENT_AMBULATORY_CARE_PROVIDER_SITE_OTHER): Payer: 59 | Admitting: Family Medicine

## 2016-02-13 ENCOUNTER — Ambulatory Visit (INDEPENDENT_AMBULATORY_CARE_PROVIDER_SITE_OTHER): Payer: 59

## 2016-02-13 VITALS — BP 110/62 | HR 64 | Temp 98.6°F | Resp 16 | Ht 72.0 in | Wt 147.0 lb

## 2016-02-13 DIAGNOSIS — M545 Low back pain, unspecified: Secondary | ICD-10-CM

## 2016-02-13 DIAGNOSIS — R1084 Generalized abdominal pain: Secondary | ICD-10-CM | POA: Diagnosis not present

## 2016-02-13 LAB — POCT URINALYSIS DIP (MANUAL ENTRY)
BILIRUBIN UA: NEGATIVE
BILIRUBIN UA: NEGATIVE
GLUCOSE UA: NEGATIVE
Leukocytes, UA: NEGATIVE
Nitrite, UA: NEGATIVE
PH UA: 7.5
Protein Ur, POC: NEGATIVE
RBC UA: NEGATIVE
SPEC GRAV UA: 1.02
Urobilinogen, UA: 0.2

## 2016-02-13 LAB — POCT CBC
Granulocyte percent: 51.4 %G (ref 37–80)
HEMATOCRIT: 41.8 % — AB (ref 43.5–53.7)
HEMOGLOBIN: 14.5 g/dL (ref 14.1–18.1)
Lymph, poc: 2 (ref 0.6–3.4)
MCH: 28.2 pg (ref 27–31.2)
MCHC: 34.7 g/dL (ref 31.8–35.4)
MCV: 81.2 fL (ref 80–97)
MID (CBC): 0.2 (ref 0–0.9)
MPV: 8.2 fL (ref 0–99.8)
POC GRANULOCYTE: 2.4 (ref 2–6.9)
POC LYMPH PERCENT: 43.5 %L (ref 10–50)
POC MID %: 5.1 % (ref 0–12)
Platelet Count, POC: 147 10*3/uL (ref 142–424)
RBC: 5.14 M/uL (ref 4.69–6.13)
RDW, POC: 13.3 %
WBC: 4.7 10*3/uL (ref 4.6–10.2)

## 2016-02-13 LAB — POC MICROSCOPIC URINALYSIS (UMFC): Mucus: ABSENT

## 2016-02-13 NOTE — Patient Instructions (Addendum)
IF you received an x-ray today, you will receive an invoice from [facility]. Please contact [facility] at [phone] with questions or concerns regarding your invoice.   IF you received labwork today, you will receive an invoice from [facility]. Please contact [facility] at [phone] with questions or concerns regarding your invoice.   Our billing staff will not be able to assist you with questions regarding bills from these companies.  You will be contacted with the lab results as soon as they are available. The fastest way to get your results is to activate your My Chart account. Instructions are located on the last page of this paperwork. If you have not heard from us regarding the results in 2 weeks, please contact this office.     Your blood counts, x-ray, and urine test in the office were normal.   See information below on abdominal pain, but for now avoid spicy foods, fried foods, and plan diet for the next few days. Make sure you're drinking plenty of fluids. If your pain continues, or any worsening, return for recheck here or the emergency room.  I do not see any concerns on your exam regarding the back pain tonight. You can try Tylenol over-the-counter as needed, heat or ice, and gentle stretches. If this pain is not improving the next 4-5 days, or worsening sooner, return for recheck.  Back Pain, Adult Back pain is very common in adults.The cause of back pain is rarely dangerous and the pain often gets better over time.The cause of your back pain may not be known. Some common causes of back pain include:  Strain of the muscles or ligaments supporting the spine.  Wear and tear (degeneration) of the spinal disks.  Arthritis.  Direct injury to the back. For many people, back pain may return. Since back pain is rarely dangerous, most people can learn to manage this condition on their own. HOME CARE INSTRUCTIONS Watch your  back pain for any changes. The following actions may help to lessen any discomfort you are feeling:  Remain active. It is stressful on your back to sit or stand in one place for long periods of time. Do not sit, drive, or stand in one place for more than 30 minutes at a time. Take short walks on even surfaces as soon as you are able.Try to increase the length of time you walk each day.  Exercise regularly as directed by your health care provider. Exercise helps your back heal faster. It also helps avoid future injury by keeping your muscles strong and flexible.  Do not stay in bed.Resting more than 1-2 days can delay your recovery.  Pay attention to your body when you bend and lift. The most comfortable positions are those that put less stress on your recovering back. Always use proper lifting techniques, including:  Bending your knees.  Keeping the load close to your body.  Avoiding twisting.  Find a comfortable position to sleep. Use a firm mattress and lie on your side with your knees slightly bent. If you lie on your back, put a pillow under your knees.  Avoid feeling anxious or stressed.Stress increases muscle tension and can worsen back pain.It is important to recognize when you are anxious or stressed and learn ways to manage it, such as with exercise.  Take medicines only as directed by your health care provider. Over-the-counter medicines to reduce pain and inflammation are often the most helpful.Your health care provider may prescribe  muscle relaxant drugs.These medicines help dull your pain so you can more quickly return to your normal activities and healthy exercise.  Apply ice to the injured area:  Put ice in a plastic bag.  Place a towel between your skin and the bag.  Leave the ice on for 20 minutes, 2-3 times a day for the first 2-3 days. After that, ice and heat may be alternated to reduce pain and spasms.  Maintain a healthy weight. Excess weight puts extra  stress on your back and makes it difficult to maintain good posture. SEEK MEDICAL CARE IF:  You have pain that is not relieved with rest or medicine.  You have increasing pain going down into the legs or buttocks.  You have pain that does not improve in one week.  You have night pain.  You lose weight.  You have a fever or chills. SEEK IMMEDIATE MEDICAL CARE IF:   You develop new bowel or bladder control problems.  You have unusual weakness or numbness in your arms or legs.  You develop nausea or vomiting.  You develop abdominal pain.  You feel faint.   This information is not intended to replace advice given to you by your health care provider. Make sure you discuss any questions you have with your health care provider.   Document Released: 08/04/2005 Document Revised: 08/25/2014 Document Reviewed: 12/06/2013 Elsevier Interactive Patient Education 2016 Elsevier Inc. Abdominal Pain, Adult Many things can cause abdominal pain. Usually, abdominal pain is not caused by a disease and will improve without treatment. It can often be observed and treated at home. Your health care provider will do a physical exam and possibly order blood tests and X-rays to help determine the seriousness of your pain. However, in many cases, more time must pass before a clear cause of the pain can be found. Before that point, your health care provider may not know if you need more testing or further treatment. HOME CARE INSTRUCTIONS Monitor your abdominal pain for any changes. The following actions may help to alleviate any discomfort you are experiencing:  Only take over-the-counter or prescription medicines as directed by your health care provider.  Do not take laxatives unless directed to do so by your health care provider.  Try a clear liquid diet (broth, tea, or water) as directed by your health care provider. Slowly move to a bland diet as tolerated. SEEK MEDICAL CARE IF:  You have  unexplained abdominal pain.  You have abdominal pain associated with nausea or diarrhea.  You have pain when you urinate or have a bowel movement.  You experience abdominal pain that wakes you in the night.  You have abdominal pain that is worsened or improved by eating food.  You have abdominal pain that is worsened with eating fatty foods.  You have a fever. SEEK IMMEDIATE MEDICAL CARE IF:  Your pain does not go away within 2 hours.  You keep throwing up (vomiting).  Your pain is felt only in portions of the abdomen, such as the right side or the left lower portion of the abdomen.  You pass bloody or black tarry stools. MAKE SURE YOU:  Understand these instructions.  Will watch your condition.  Will get help right away if you are not doing well or get worse.   This information is not intended to replace advice given to you by your health care provider. Make sure you discuss any questions you have with your health care provider.   Document Released:  05/14/2005 Document Revised: 04/25/2015 Document Reviewed: 04/13/2013 Elsevier Interactive Patient Education [employer] Elsevier Inc.

## 2016-02-13 NOTE — Progress Notes (Signed)
By signing my name below, I, Mesha Guinyard, attest that this documentation has been prepared under the direction and in the presence of Meredith StaggersJeffrey Placido Hangartner, MD.  Electronically Signed: Arvilla MarketMesha Guinyard, Medical Scribe. 02/13/2016. 5:35 PM.  Subjective:    Patient ID: Willie Trujillo, male    DOB: 03/14/1991, 25 y.o.   MRN: [id number]  HPI Chief Complaint  Patient presents with  . Abdominal Pain    x2days. Denies N/V/D  . Back Pain    HPI Comments: Willie Trujillo is a 25 y.o. male who presents to the [facility] complaining of sudden abdominal pain onset 2 days ago. Pt locates the pain in his umbilicus when asked. Pt states the pain is intermittent. Pt reports bloating. Pt has regular bowel movement, pt hasn't had a bm today. Pt states his abdominal pain is new. No attempted treatments.  Back pain: A sharp lower back pain onset today. 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 Pt denies injury to his back. Pt hasn't used any medication for his symptoms. Pt states his legs occasionally feel weak, but no radiating pain to the legs. No current weakness.  Patient Active Problem List   Diagnosis Date Noted  . Sprained finger and thumb 03/14/2015   No past medical history on file. No past surgical history on file. No Known Allergies Prior to Admission medications   Not on File   Social History   Social History  . Marital Status: Single    Spouse Name: N/A  . Number of Children: N/A  . Years of Education: N/A   Occupational History  . Not on file.   Social History Main Topics  . Smoking status: Never Smoker   . Smokeless tobacco: Never Used  . Alcohol Use: No  . Drug Use: No  . Sexual Activity: Not on file   Other Topics Concern  . Not on file   Social History Narrative   Review of Systems  Constitutional: Negative for fever, chills and diaphoresis.  Gastrointestinal: Positive for abdominal pain and abdominal distention. Negative for nausea,  vomiting, diarrhea, constipation and blood in stool.  Genitourinary: Negative for hematuria, penile pain and testicular pain.  Musculoskeletal: Positive for back pain.  Neurological: Positive for weakness.   Objective:  BP 110/62 mmHg  Pulse 64  Temp(Src) 98.6 F (37 C) (Oral)  Resp 16  Ht 6' (1.829 m)  Wt 147 lb (66.679 kg)  BMI 19.93 kg/m2  SpO2 100%  Physical Exam  Constitutional: He appears well-developed and well-nourished. No distress.  HENT:  Head: Normocephalic and atraumatic.  Eyes: Conjunctivae are normal.  Neck: Neck supple.  Cardiovascular: Normal rate.   Pulmonary/Chest: Effort normal.  Abdominal: Bowel sounds are normal. He exhibits no distension.  Musculoskeletal:  No focal tendernes along lumbar spine but the area of discomfort is the paraspinals Lumbar spine FROM pain free Negative seat to straight leg raise No radiation of pain down his legs  Neurological: He is alert.  Reflex Scores:      Patellar reflexes are 2+ on the right side and 2+ on the left side.      Achilles reflexes are 2+ on the right side and 2+ on the left side. Lower extremity strength is intact  Skin: Skin is warm and dry.  Psychiatric: He has a normal mood and affect. His behavior is normal.  Nursing note and vitals reviewed.   Results for orders placed or performed in visit on 02/13/16  POCT urinalysis dipstick  Result Value Ref Range   Color, UA yellow yellow   Clarity, UA hazy (A) clear   Glucose, UA negative negative   Bilirubin, UA negative negative   Ketones, POC UA negative negative   Spec Grav, UA 1.020    Blood, UA negative negative   pH, UA 7.5    Protein Ur, POC negative negative   Urobilinogen, UA 0.2    Nitrite, UA Negative Negative   Leukocytes, UA Negative Negative  POCT Microscopic Urinalysis ([facility])  Result Value Ref Range   WBC,UR,HPF,POC None None WBC/hpf   RBC,UR,HPF,POC None None RBC/hpf   Bacteria None None, Too numerous to count   Mucus Absent Absent     Epithelial Cells, UR Per Microscopy None None, Too numerous to count cells/hpf  POCT CBC  Result Value Ref Range   WBC 4.7 4.6 - 10.2 K/uL   Lymph, poc 2.0 0.6 - 3.4   POC LYMPH PERCENT 43.5 10 - 50 %L   MID (cbc) 0.2 0 - 0.9   POC MID % 5.1 0 - 12 %M   POC Granulocyte 2.4 2 - 6.9   Granulocyte percent 51.4 37 - 80 %G   RBC 5.14 4.69 - 6.13 M/uL   Hemoglobin 14.5 14.1 - 18.1 g/dL   HCT, POC 45.4 (A) 09.8 - 53.7 %   MCV 81.2 80 - 97 fL   MCH, POC 28.2 27 - 31.2 pg   MCHC 34.7 31.8 - 35.4 g/dL   RDW, POC 11.9 %   Platelet Count, POC 147 142 - 424 K/uL   MPV 8.2 0 - 99.8 fL   Dg Abd 1 View  02/13/2016  CLINICAL DATA:  Epigastric and periumbilical pain. EXAM: ABDOMEN - 1 VIEW COMPARISON:  None. FINDINGS: There is no evidence for gaseous bowel dilation to suggest obstruction. Phlebolith overlies the right anatomic pelvis. Visualized bony anatomy is unremarkable. IMPRESSION: Negative. Electronically Signed   By: Kennith Center M.D.   On: 02/13/2016 17:59   Assessment & Plan:  Willie Trujillo is a 25 y.o. male Generalized abdominal pain - Plan: POCT urinalysis dipstick, POCT Microscopic Urinalysis ([facility]), POCT CBC, DG Abd 1 View  - Nontender exam. Loading/gas possible. Discussed constipation as other possible cause, but does not appear to have significant stool burden based on abdominal x-ray. Reassuring CBC.  -bland diet, fluids, RTC precautions if persistent or worsening.  Bilateral low back pain without sciatica  - Likely mechanical/due to new job. No concerning findings on exam or history. X-ray deferred at present. Symptomatic care discussed and RTC precautions if persistent.  No orders of the defined types were placed in this encounter.   Patient Instructions       IF you received an x-ray today, you will receive an invoice from [facility]. Please contact [facility] at [phone] with questions or concerns regarding your invoice.   IF you received  labwork today, you will receive an invoice from [facility]. Please contact [facility] at [phone] with questions or concerns regarding your invoice.   Our billing staff will not be able to assist you with questions regarding bills from these companies.  You will be contacted with the lab results as soon as they are available. The fastest way to get your results is to activate your My Chart account. Instructions are located on the last page of this paperwork. If you have not heard from [place] regarding the results in 2 weeks, please contact this office.  Your blood counts, x-ray, and urine test in the office were normal.   See information below on abdominal pain, but for now avoid spicy foods, fried foods, and plan diet for the next few days. Make sure you're drinking plenty of fluids. If your pain continues, or any worsening, return for recheck here or the emergency room.  I do not see any concerns on your exam regarding the back pain tonight. You can try Tylenol over-the-counter as needed, heat or ice, and gentle stretches. If this pain is not improving the next 4-5 days, or worsening sooner, return for recheck.  Back Pain, Adult Back pain is very common in adults.The cause of back pain is rarely dangerous and the pain often gets better over time.The cause of your back pain may not be known. Some common causes of back pain include:  Strain of the muscles or ligaments supporting the spine.  Wear and tear (degeneration) of the spinal disks.  Arthritis.  Direct injury to the back. For many people, back pain may return. Since back pain is rarely dangerous, most people can learn to manage this condition on their own. HOME CARE INSTRUCTIONS Watch your back pain for any changes. The following actions may help to lessen any discomfort you are feeling:  Remain active. It is stressful on your back to sit or stand in one place for long periods of time. Do not sit, drive,  or stand in one place for more than 30 minutes at a time. Take short walks on even surfaces as soon as you are able.Try to increase the length of time you walk each day.  Exercise regularly as directed by your health care provider. Exercise helps your back heal faster. It also helps avoid future injury by keeping your muscles strong and flexible.  Do not stay in bed.Resting more than 1-2 days can delay your recovery.  Pay attention to your body when you bend and lift. The most comfortable positions are those that put less stress on your recovering back. Always use proper lifting techniques, including:  Bending your knees.  Keeping the load close to your body.  Avoiding twisting.  Find a comfortable position to sleep. Use a firm mattress and lie on your side with your knees slightly bent. If you lie on your back, put a pillow under your knees.  Avoid feeling anxious or stressed.Stress increases muscle tension and can worsen back pain.It is important to recognize when you are anxious or stressed and learn ways to manage it, such as with exercise.  Take medicines only as directed by your health care provider. Over-the-counter medicines to reduce pain and inflammation are often the most helpful.Your health care provider may prescribe muscle relaxant drugs.These medicines help dull your pain so you can more quickly return to your normal activities and healthy exercise.  Apply ice to the injured area:  Put ice in a plastic bag.  Place a towel between your skin and the bag.  Leave the ice on for 20 minutes, 2-3 times a day for the first 2-3 days. After that, ice and heat may be alternated to reduce pain and spasms.  Maintain a healthy weight. Excess weight puts extra stress on your back and makes it difficult to maintain good posture. SEEK MEDICAL CARE IF:  You have pain that is not relieved with rest or medicine.  You have increasing pain going down into the legs or buttocks.  You  have pain that does not improve in one week.  You  have night pain.  You lose weight.  You have a fever or chills. SEEK IMMEDIATE MEDICAL CARE IF:   You develop new bowel or bladder control problems.  You have unusual weakness or numbness in your arms or legs.  You develop nausea or vomiting.  You develop abdominal pain.  You feel faint.   This information is not intended to replace advice given to you by your health care provider. Make sure you discuss any questions you have with your health care provider.   Document Released: 08/04/2005 Document Revised: 08/25/2014 Document Reviewed: 12/06/2013 Elsevier Interactive Patient Education 2016 Elsevier Inc. Abdominal Pain, Adult Many things can cause abdominal pain. Usually, abdominal pain is not caused by a disease and will improve without treatment. It can often be observed and treated at home. Your health care provider will do a physical exam and possibly order blood tests and X-rays to help determine the seriousness of your pain. However, in many cases, more time must pass before a clear cause of the pain can be found. Before that point, your health care provider may not know if you need more testing or further treatment. HOME CARE INSTRUCTIONS Monitor your abdominal pain for any changes. The following actions may help to alleviate any discomfort you are experiencing:  Only take over-the-counter or prescription medicines as directed by your health care provider.  Do not take laxatives unless directed to do so by your health care provider.  Try a clear liquid diet (broth, tea, or water) as directed by your health care provider. Slowly move to a bland diet as tolerated. SEEK MEDICAL CARE IF:  You have unexplained abdominal pain.  You have abdominal pain associated with nausea or diarrhea.  You have pain when you urinate or have a bowel movement.  You experience abdominal pain that wakes you in the night.  You have abdominal  pain that is worsened or improved by eating food.  You have abdominal pain that is worsened with eating fatty foods.  You have a fever. SEEK IMMEDIATE MEDICAL CARE IF:  Your pain does not go away within 2 hours.  You keep throwing up (vomiting).  Your pain is felt only in portions of the abdomen, such as the right side or the left lower portion of the abdomen.  You pass bloody or black tarry stools. MAKE SURE YOU:  Understand these instructions.  Will watch your condition.  Will get help right away if you are not doing well or get worse.   This information is not intended to replace advice given to you by your health care provider. Make sure you discuss any questions you have with your health care provider.   Document Released: 05/14/2005 Document Revised: 04/25/2015 Document Reviewed: 04/13/2013 Elsevier Interactive Patient Education [employer]     I personally performed the services described in this documentation, which was scribed in my presence. The recorded information has been reviewed and considered, and addended by me as needed.   Signed,   Meredith Staggers, MD [facility].  02/13/2016 8:11 PM

## 2016-03-11 ENCOUNTER — Ambulatory Visit: Payer: 59

## 2017-07-16 IMAGING — DX DG ABDOMEN 1V
1 series · 1 of 1 positions shown · non-contrast
Comparison: None.

CLINICAL DATA: Epigastric and periumbilical pain.

EXAM:
ABDOMEN - 1 VIEW

[abdomen kub]
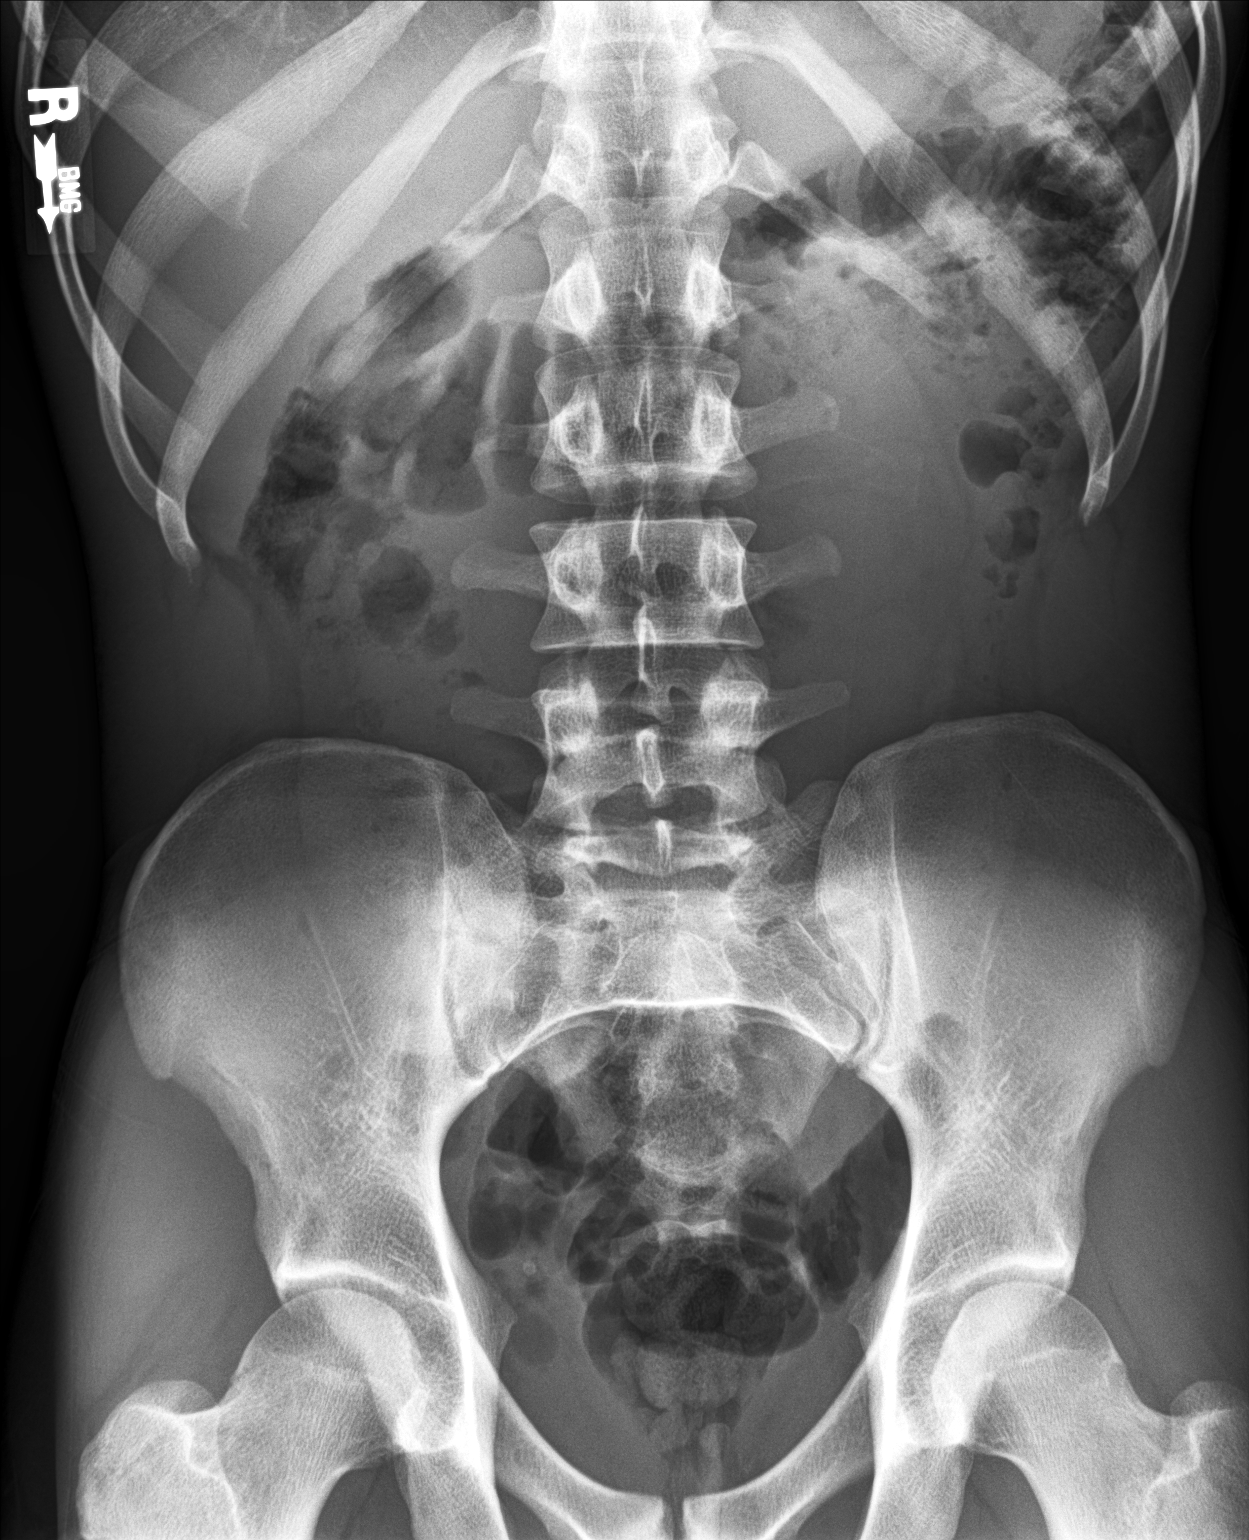

[1 of 1 positions shown; findings below may reference images not displayed]

FINDINGS: There is no evidence for gaseous bowel dilation to suggest
obstruction. Phlebolith overlies the right anatomic pelvis.
Visualized bony anatomy is unremarkable.
IMPRESSION: Negative.
